# Patient Record
Sex: Female | Born: 1985 | Race: Black or African American | Hispanic: No | Marital: Single | Smoking: Never smoker
Health system: Southern US, Community
[De-identification: ages and names within clinical notes are randomized; demographics above are authoritative.]

---

## 2016-01-08 ENCOUNTER — Other Ambulatory Visit (HOSPITAL_COMMUNITY)
Admission: RE | Admit: 2016-01-08 | Discharge: 2016-01-08 | Disposition: A | Discharge status: Home / Self Care | Payer: Medicaid Other | Source: Ambulatory Visit | Attending: Obstetrics & Gynecology | Admitting: Obstetrics & Gynecology

## 2016-01-08 ENCOUNTER — Encounter: Payer: Self-pay | Admitting: Obstetrics & Gynecology

## 2016-01-08 ENCOUNTER — Encounter: Payer: Self-pay | Admitting: *Deleted

## 2016-01-08 ENCOUNTER — Ambulatory Visit (INDEPENDENT_AMBULATORY_CARE_PROVIDER_SITE_OTHER): Payer: Medicaid Other | Admitting: Obstetrics & Gynecology

## 2016-01-08 VITALS — BP 118/78 | HR 97 | Wt 131.0 lb

## 2016-01-08 DIAGNOSIS — Z1151 Encounter for screening for human papillomavirus (HPV): Secondary | ICD-10-CM | POA: Insufficient documentation

## 2016-01-08 DIAGNOSIS — Z3491 Encounter for supervision of normal pregnancy, unspecified, first trimester: Secondary | ICD-10-CM

## 2016-01-08 DIAGNOSIS — O34219 Maternal care for unspecified type scar from previous cesarean delivery: Secondary | ICD-10-CM | POA: Diagnosis not present

## 2016-01-08 DIAGNOSIS — Z36 Encounter for antenatal screening of mother: Secondary | ICD-10-CM | POA: Diagnosis not present

## 2016-01-08 DIAGNOSIS — N760 Acute vaginitis: Secondary | ICD-10-CM | POA: Insufficient documentation

## 2016-01-08 DIAGNOSIS — R87810 Cervical high risk human papillomavirus (HPV) DNA test positive: Secondary | ICD-10-CM | POA: Insufficient documentation

## 2016-01-08 DIAGNOSIS — Z01419 Encounter for gynecological examination (general) (routine) without abnormal findings: Secondary | ICD-10-CM | POA: Diagnosis present

## 2016-01-08 DIAGNOSIS — Z113 Encounter for screening for infections with a predominantly sexual mode of transmission: Secondary | ICD-10-CM | POA: Insufficient documentation

## 2016-01-08 DIAGNOSIS — Z3481 Encounter for supervision of other normal pregnancy, first trimester: Secondary | ICD-10-CM | POA: Diagnosis not present

## 2016-01-08 DIAGNOSIS — Z3490 Encounter for supervision of normal pregnancy, unspecified, unspecified trimester: Secondary | ICD-10-CM | POA: Insufficient documentation

## 2016-01-08 MED ORDER — PRENATAL 27-0.8 MG PO TABS: 1.0000 | ORAL_TABLET | Freq: Every day | ORAL | Status: AC

## 2016-01-08 NOTE — Progress Notes (Signed)
Is due for a pap smear.  Seen at [facility] in [place] on 1/28 and had ultrasound at that time.  Will request records.  LMP dating matches what she was told at [facility].  Some nausea. Would like an Rx for prenatal vitamins.

## 2016-01-08 NOTE — Patient Instructions (Signed)
First Trimester of Pregnancy The first trimester of pregnancy is from week 1 until the end of week 12 (months 1 through 3). A week after a sperm fertilizes an egg, the egg will implant on the wall of the uterus. This embryo will begin to develop into a baby. Genes from you and your partner are forming the baby. The female genes determine whether the baby is a boy or a girl. At 6-8 weeks, the eyes and face are formed, and the heartbeat can be seen on ultrasound. At the end of 12 weeks, all the baby's organs are formed.  Now that you are pregnant, you will want to do everything you can to have a healthy baby. Two of the most important things are to get good prenatal care and to follow your health care provider's instructions. Prenatal care is all the medical care you receive before the baby's birth. This care will help prevent, find, and treat any problems during the pregnancy and childbirth. BODY CHANGES Your body goes through many changes during pregnancy. The changes vary from woman to woman.   You may gain or lose a couple of pounds at first.  You may feel sick to your stomach (nauseous) and throw up (vomit). If the vomiting is uncontrollable, call your health care provider.  You may tire easily.  You may develop headaches that can be relieved by medicines approved by your health care provider.  You may urinate more often. Painful urination may mean you have a bladder infection.  You may develop heartburn as a result of your pregnancy.  You may develop constipation because certain hormones are causing the muscles that push waste through your intestines to slow down.  You may develop hemorrhoids or swollen, bulging veins (varicose veins).  Your breasts may begin to grow larger and become tender. Your nipples may stick out more, and the tissue that surrounds them (areola) may become darker.  Your gums may bleed and may be sensitive to brushing and flossing.  Dark spots or blotches (chloasma,  mask of pregnancy) may develop on your face. This will likely fade after the baby is born.  Your menstrual periods will stop.  You may have a loss of appetite.  You may develop cravings for certain kinds of food.  You may have changes in your emotions from day to day, such as being excited to be pregnant or being concerned that something may go wrong with the pregnancy and baby.  You may have more vivid and strange dreams.  You may have changes in your hair. These can include thickening of your hair, rapid growth, and changes in texture. Some women also have hair loss during or after pregnancy, or hair that feels dry or thin. Your hair will most likely return to normal after your baby is born. WHAT TO EXPECT AT YOUR PRENATAL VISITS During a routine prenatal visit:  You will be weighed to make sure you and the baby are growing normally.  Your blood pressure will be taken.  Your abdomen will be measured to track your baby's growth.  The fetal heartbeat will be listened to starting around week 10 or 12 of your pregnancy.  Test results from any previous visits will be discussed. Your health care provider may ask you:  How you are feeling.  If you are feeling the baby move.  If you have had any abnormal symptoms, such as leaking fluid, bleeding, severe headaches, or abdominal cramping.  If you are using any tobacco products,   including cigarettes, chewing tobacco, and electronic cigarettes.  If you have any questions. Other tests that may be performed during your first trimester include:  Blood tests to find your blood type and to check for the presence of any previous infections. They will also be used to check for low iron levels (anemia) and Rh antibodies. Later in the pregnancy, blood tests for diabetes will be done along with other tests if problems develop.  Urine tests to check for infections, diabetes, or protein in the urine.  An ultrasound to confirm the proper growth  and development of the baby.  An amniocentesis to check for possible genetic problems.  Fetal screens for spina bifida and Down syndrome.  You may need other tests to make sure you and the baby are doing well.  HIV (human immunodeficiency virus) testing. Routine prenatal testing includes screening for HIV, unless you choose not to have this test. HOME CARE INSTRUCTIONS  Medicines  Follow your health care provider's instructions regarding medicine use. Specific medicines may be either safe or unsafe to take during pregnancy.  Take your prenatal vitamins as directed.  If you develop constipation, try taking a stool softener if your health care provider approves. Diet  Eat regular, well-balanced meals. Choose a variety of foods, such as meat or vegetable-based protein, fish, milk and low-fat dairy products, vegetables, fruits, and whole grain breads and cereals. Your health care provider will help you determine the amount of weight gain that is right for you.  Avoid raw meat and uncooked cheese. These carry germs that can cause birth defects in the baby.  Eating four or five small meals rather than three large meals a day may help relieve nausea and vomiting. If you start to feel nauseous, eating a few soda crackers can be helpful. Drinking liquids between meals instead of during meals also seems to help nausea and vomiting.  If you develop constipation, eat more high-fiber foods, such as fresh vegetables or fruit and whole grains. Drink enough fluids to keep your urine clear or pale yellow. Activity and Exercise  Exercise only as directed by your health care provider. Exercising will help you:  Control your weight.  Stay in shape.  Be prepared for labor and delivery.  Experiencing pain or cramping in the lower abdomen or low back is a good sign that you should stop exercising. Check with your health care provider before continuing normal exercises.  Try to avoid standing for long  periods of time. Move your legs often if you must stand in one place for a long time.  Avoid heavy lifting.  Wear low-heeled shoes, and practice good posture.  You may continue to have sex unless your health care provider directs you otherwise. Relief of Pain or Discomfort  Wear a good support bra for breast tenderness.   Take warm sitz baths to soothe any pain or discomfort caused by hemorrhoids. Use hemorrhoid cream if your health care provider approves.   Rest with your legs elevated if you have leg cramps or low back pain.  If you develop varicose veins in your legs, wear support hose. Elevate your feet for 15 minutes, 3-4 times a day. Limit salt in your diet. Prenatal Care  Schedule your prenatal visits by the twelfth week of pregnancy. They are usually scheduled monthly at first, then more often in the last 2 months before delivery.  Write down your questions. Take them to your prenatal visits.  Keep all your prenatal visits as directed by your   health care provider. Safety  Wear your seat belt at all times when driving.  Make a list of emergency phone numbers, including numbers for family, friends, the hospital, and police and fire departments. General Tips  Ask your health care provider for a referral to a local prenatal education class. Begin classes no later than at the beginning of month 6 of your pregnancy.  Ask for help if you have counseling or nutritional needs during pregnancy. Your health care provider can offer advice or refer you to specialists for help with various needs.  Do not use hot tubs, steam rooms, or saunas.  Do not douche or use tampons or scented sanitary pads.  Do not cross your legs for long periods of time.  Avoid cat litter boxes and soil used by cats. These carry germs that can cause birth defects in the baby and possibly loss of the fetus by miscarriage or stillbirth.  Avoid all smoking, herbs, alcohol, and medicines not prescribed by  your health care provider. Chemicals in these affect the formation and growth of the baby.  Do not use any tobacco products, including cigarettes, chewing tobacco, and electronic cigarettes. If you need help quitting, ask your health care provider. You may receive counseling support and other resources to help you quit.  Schedule a dentist appointment. At home, brush your teeth with a soft toothbrush and be gentle when you floss. SEEK MEDICAL CARE IF:   You have dizziness.  You have mild pelvic cramps, pelvic pressure, or nagging pain in the abdominal area.  You have persistent nausea, vomiting, or diarrhea.  You have a bad smelling vaginal discharge.  You have pain with urination.  You notice increased swelling in your face, hands, legs, or ankles. SEEK IMMEDIATE MEDICAL CARE IF:   You have a fever.  You are leaking fluid from your vagina.  You have spotting or bleeding from your vagina.  You have severe abdominal cramping or pain.  You have rapid weight gain or loss.  You vomit blood or material that looks like coffee grounds.  You are exposed to German measles and have never had them.  You are exposed to fifth disease or chickenpox.  You develop a severe headache.  You have shortness of breath.  You have any kind of trauma, such as from a fall or a car accident.   This information is not intended to replace advice given to you by your health care provider. Make sure you discuss any questions you have with your health care provider.   Document Released: 10/20/2001 Document Revised: 11/16/2014 Document Reviewed: 09/05/2013 Elsevier Interactive Patient Education 2016 [employer]  

## 2016-01-08 NOTE — Progress Notes (Signed)
   Subjective: mild nausea    Megan Boyer is a G5P3003 [redacted]w[redacted]d being seen today for her first obstetrical visit.  Her obstetrical history is significant for previous cesarean section needs repeat. Patient does intend to breast feed. Pregnancy history fully reviewed.  Patient reports nausea.  Filed Vitals:   01/08/16 0944  BP: 118/78  Pulse: 97  Weight: 131 lb (59.421 kg)    HISTORY: OB History  Gravida Para Term Preterm AB SAB TAB Ectopic Multiple Living  # Outcome Date GA Lbr Len/2nd Weight Sex Delivery Anes PTL Lv  5 Current           4 Term 07/24/13    Wandalee Ferdinand   Y  3 Gravida 03/12/08    F CS-LTranv   Y  2 Term 10/29/03    Wandalee Ferdinand   Y     Complications: Fetal Intolerance  1 Term              No past medical history on file. No past surgical history on file. Family History  Problem Relation Age of Onset  . Cancer Maternal Grandmother   . Liver disease Paternal Grandmother   . Liver disease Paternal Grandfather      Exam    Uterus:     Pelvic Exam:    Perineum: No Hemorrhoids   Vulva: normal   Vagina:  normal mucosa, normal discharge   pH:     Cervix: no lesions   Adnexa: normal adnexa   Bony Pelvis: average  System: Breast:  normal appearance, no masses or tenderness   Skin: normal coloration and turgor, no rashes    Neurologic: oriented, normal mood   Extremities: normal strength, tone, and muscle mass   HEENT trachea midline   Mouth/Teeth dental hygiene good   Neck supple   Cardiovascular: regular rate and rhythm   Respiratory:  appears well, vitals normal, no respiratory distress, acyanotic, normal RR   Abdomen: soft, non-tender; bowel sounds normal; no masses,  no organomegaly, transverse low abdominal scar   Urinary: urethral meatus normal      Assessment:    Pregnancy: [id number] Patient Active Problem List   Diagnosis Date Noted  . Supervision of low-risk pregnancy 01/08/2016  . Previous cesarean delivery,  antepartum 01/08/2016        Plan:     Initial labs drawn. Prenatal vitamins. Problem list reviewed and updated. Genetic Screening discussed First Screen: declined.  Ultrasound discussed; fetal survey: at 18+ weeks.  Follow up in 4 weeks. 50% of 30 min visit spent on counseling and coordination of care.  US shows viable singleton pregnancy   ARNOLD,JAMES 01/08/2016

## 2016-01-09 LAB — PRENATAL PROFILE (SOLSTAS)
Antibody Screen: NEGATIVE
Basophils Absolute: 0.1 10*3/uL (ref 0.0–0.1)
Basophils Relative: 1 % (ref 0–1)
Eosinophils Absolute: 0.4 10*3/uL (ref 0.0–0.7)
Eosinophils Relative: 6 % — ABNORMAL HIGH (ref 0–5)
HCT: 36.9 % (ref 36.0–46.0)
HEP B S AG: NEGATIVE
HIV: NONREACTIVE
Hemoglobin: 12.4 g/dL (ref 12.0–15.0)
LYMPHS PCT: 28 % (ref 12–46)
Lymphs Abs: 1.7 10*3/uL (ref 0.7–4.0)
MCH: 30.3 pg (ref 26.0–34.0)
MCHC: 33.6 g/dL (ref 30.0–36.0)
MCV: 90.2 fL (ref 78.0–100.0)
MONO ABS: 0.6 10*3/uL (ref 0.1–1.0)
MONOS PCT: 11 % (ref 3–12)
MPV: 10.6 fL (ref 8.6–12.4)
NEUTROS ABS: 3.2 10*3/uL (ref 1.7–7.7)
Neutrophils Relative %: 54 % (ref 43–77)
PLATELETS: 202 10*3/uL (ref 150–400)
RBC: 4.09 MIL/uL (ref 3.87–5.11)
RDW: 13.2 % (ref 11.5–15.5)
RUBELLA: 4.89 {index} — AB (ref <0.90)
Rh Type: POSITIVE
WBC: 5.9 10*3/uL (ref 4.0–10.5)

## 2016-01-09 LAB — CULTURE, OB URINE
COLONY COUNT: NO GROWTH
ORGANISM ID, BACTERIA: NO GROWTH

## 2016-01-09 LAB — CYTOLOGY - PAP

## 2016-01-10 LAB — CERVICOVAGINAL ANCILLARY ONLY: CANDIDA VAGINITIS: NEGATIVE

## 2016-01-14 ENCOUNTER — Telehealth: Payer: Self-pay | Admitting: General Practice

## 2016-01-14 DIAGNOSIS — N760 Acute vaginitis: Principal | ICD-10-CM

## 2016-01-14 DIAGNOSIS — B9689 Other specified bacterial agents as the cause of diseases classified elsewhere: Secondary | ICD-10-CM

## 2016-01-14 MED ORDER — METRONIDAZOLE 500 MG PO TABS: 500.0000 mg | ORAL_TABLET | Freq: Two times a day (BID) | ORAL | Status: DC

## 2016-01-14 NOTE — Telephone Encounter (Signed)
Per Dr Debroah LoopArnold, patient has BV and needs Rx sent to pharmacy. Called patient & informed her of results & medication sent to pharmacy. Patient verbalized understanding & had no questions

## 2016-01-22 ENCOUNTER — Telehealth: Payer: Self-pay | Admitting: *Deleted

## 2016-01-22 MED ORDER — TERCONAZOLE 0.4 % VA CREA: TOPICAL_CREAM | VAGINAL | Status: AC

## 2016-01-22 NOTE — Telephone Encounter (Signed)
Called pt and informed her of Pap results and recommendation to repeat Pap w/co-testing in 1 year.  Pt voiced understanding and requested Rx for vaginal yeast because she just finished Tx for BV w/metronidazole and has vaginal itching and white discharge. Rx for Terazol sent per standing order.

## 2016-01-27 ENCOUNTER — Encounter: Payer: Self-pay | Admitting: *Deleted

## 2016-02-05 ENCOUNTER — Ambulatory Visit (INDEPENDENT_AMBULATORY_CARE_PROVIDER_SITE_OTHER): Payer: Medicaid Other | Admitting: Certified Nurse Midwife

## 2016-02-05 VITALS — BP 124/81 | HR 93 | Ht 63.0 in | Wt 131.0 lb

## 2016-02-05 DIAGNOSIS — Z3482 Encounter for supervision of other normal pregnancy, second trimester: Secondary | ICD-10-CM

## 2016-02-05 DIAGNOSIS — O34219 Maternal care for unspecified type scar from previous cesarean delivery: Secondary | ICD-10-CM

## 2016-02-05 DIAGNOSIS — Z3492 Encounter for supervision of normal pregnancy, unspecified, second trimester: Secondary | ICD-10-CM

## 2016-02-05 NOTE — Patient Instructions (Signed)

## 2016-02-05 NOTE — Progress Notes (Signed)
Subjective:  Megan Boyer is a 30 y.o. G5P3003 at 2215w5d being seen today for ongoing prenatal care.  She is currently monitored for the following issues for this low-risk pregnancy and has Supervision of low-risk pregnancy and Previous cesarean delivery, antepartum on her problem list.  Patient reports no complaints.  Contractions: Not present. Vag. Bleeding: None.  Movement: Absent. Denies leaking of fluid.   The following portions of the patient's history were reviewed and updated as appropriate: allergies, current medications, past family history, past medical history, past social history, past surgical history and problem list. Problem list updated.  Objective:   Filed Vitals:   02/05/16 1515 02/05/16 1520  BP: 124/81   Pulse: 93   Height:  5\' 3"  (1.6 m)  Weight: 131 lb (59.421 kg)     Fetal Status:     Movement: Absent     General:  Alert, oriented and cooperative. Patient is in no acute distress.  Skin: Skin is warm and dry. No rash noted.   Cardiovascular: Normal heart rate noted  Respiratory: Normal respiratory effort, no problems with respiration noted  Abdomen: Soft, gravid, appropriate for gestational age. Pain/Pressure: Absent     Pelvic: Vag. Bleeding: None Vag D/C Character: Thin   Cervical exam deferred        Extremities: Normal range of motion.     Mental Status: Normal mood and affect. Normal behavior. Normal judgment and thought content.   Urinalysis: Urine Protein: Trace Urine Glucose: Negative  Assessment and Plan:  Pregnancy: G5P3003 at 7615w5d  1. Supervision of low-risk pregnancy, second trimester  - US MFM OB COMP + 14 WK; Future  2. Previous cesarean delivery, antepartum Wants to VBAC after 3 C/S  Preterm labor symptoms and general obstetric precautions including but not limited to vaginal bleeding, contractions, leaking of fluid and fetal movement were reviewed in detail with the patient. Please refer to After Visit Summary for other  counseling recommendations.  Return in about 7 weeks (around 03/25/2016) for schedule anatomy ultrasound.   Rhea PinkLori A Clemmons, CNM

## 2016-02-24 ENCOUNTER — Encounter (HOSPITAL_COMMUNITY): Payer: Self-pay | Admitting: Certified Nurse Midwife

## 2016-03-06 ENCOUNTER — Other Ambulatory Visit: Payer: Self-pay | Admitting: Certified Nurse Midwife

## 2016-03-06 ENCOUNTER — Ambulatory Visit (HOSPITAL_COMMUNITY)
Admission: RE | Admit: 2016-03-06 | Discharge: 2016-03-06 | Disposition: A | Discharge status: Home / Self Care | Payer: Medicaid Other | Source: Ambulatory Visit | Attending: Certified Nurse Midwife | Admitting: Certified Nurse Midwife

## 2016-03-06 DIAGNOSIS — O34219 Maternal care for unspecified type scar from previous cesarean delivery: Secondary | ICD-10-CM

## 2016-03-06 DIAGNOSIS — Z3A18 18 weeks gestation of pregnancy: Secondary | ICD-10-CM | POA: Diagnosis not present

## 2016-03-06 DIAGNOSIS — Z3689 Encounter for other specified antenatal screening: Secondary | ICD-10-CM

## 2016-03-06 DIAGNOSIS — Z3492 Encounter for supervision of normal pregnancy, unspecified, second trimester: Secondary | ICD-10-CM

## 2016-03-06 DIAGNOSIS — Z36 Encounter for antenatal screening of mother: Secondary | ICD-10-CM | POA: Diagnosis not present

## 2016-03-24 ENCOUNTER — Encounter: Payer: Self-pay | Admitting: Obstetrics and Gynecology

## 2016-03-24 ENCOUNTER — Ambulatory Visit (INDEPENDENT_AMBULATORY_CARE_PROVIDER_SITE_OTHER): Payer: Medicaid Other | Admitting: Obstetrics and Gynecology

## 2016-03-24 VITALS — BP 118/73 | HR 101 | Wt 141.0 lb

## 2016-03-24 DIAGNOSIS — Z3482 Encounter for supervision of other normal pregnancy, second trimester: Secondary | ICD-10-CM

## 2016-03-24 DIAGNOSIS — O34219 Maternal care for unspecified type scar from previous cesarean delivery: Secondary | ICD-10-CM | POA: Diagnosis not present

## 2016-03-24 DIAGNOSIS — Z3492 Encounter for supervision of normal pregnancy, unspecified, second trimester: Secondary | ICD-10-CM

## 2016-03-24 NOTE — Progress Notes (Signed)
Subjective:  Megan Boyer is a 30 y.o. G5P3003 at 8734w4d being seen today for ongoing prenatal care.  She is currently monitored for the following issues for this low-risk pregnancy and has Supervision of low-risk pregnancy and Previous cesarean delivery, antepartum on her problem list.  Patient reports no complaints.  Contractions: Irregular. Vag. Bleeding: None.  Movement: Present. Denies leaking of fluid.   The following portions of the patient's history were reviewed and updated as appropriate: allergies, current medications, past family history, past medical history, past social history, past surgical history and problem list. Problem list updated.  Objective:   Filed Vitals:   03/24/16 0830  BP: 118/73  Pulse: 101  Weight: 141 lb (63.957 kg)    Fetal Status: Fetal Heart Rate (bpm): 143 Fundal Height: 20 cm Movement: Present     General:  Alert, oriented and cooperative. Patient is in no acute distress.  Skin: Skin is warm and dry. No rash noted.   Cardiovascular: Normal heart rate noted  Respiratory: Normal respiratory effort, no problems with respiration noted  Abdomen: Soft, gravid, appropriate for gestational age. Pain/Pressure: Absent     Pelvic: Vag. Bleeding: None Vag D/C Character: Thin   Cervical exam deferred        Extremities: Normal range of motion.  Edema: None  Mental Status: Normal mood and affect. Normal behavior. Normal judgment and thought content.   Urinalysis: Urine Protein: Trace Urine Glucose: Negative  Assessment and Plan:  Pregnancy: G5P3003 at 6534w4d  1. Previous cesarean delivery, antepartum Will need to be scheduled for repeat   2. Supervision of low-risk pregnancy, second trimester Continue routine prenatal care 1hr glucola and labs at next visit Reviewed anatomy ultrasound results  General obstetric precautions including but not limited to vaginal bleeding, contractions, leaking of fluid and fetal movement were reviewed in detail  with the patient. Please refer to After Visit Summary for other counseling recommendations.  Return in about 8 weeks (around 05/19/2016) for ROB and 1 hour glucola.   Catalina AntiguaPeggy Tiwanna Tuch, MD

## 2016-11-12 ENCOUNTER — Encounter (HOSPITAL_COMMUNITY): Payer: Self-pay

## 2019-01-09 ENCOUNTER — Encounter (HOSPITAL_COMMUNITY): Payer: Self-pay | Admitting: Emergency Medicine

## 2019-01-09 ENCOUNTER — Ambulatory Visit (HOSPITAL_COMMUNITY)
Admission: EM | Admit: 2019-01-09 | Discharge: 2019-01-09 | Disposition: A | Discharge status: Home / Self Care | Payer: Self-pay | Attending: Family Medicine | Admitting: Family Medicine

## 2019-01-09 DIAGNOSIS — L02412 Cutaneous abscess of left axilla: Secondary | ICD-10-CM

## 2019-01-09 MED ORDER — SULFAMETHOXAZOLE-TRIMETHOPRIM 800-160 MG PO TABS: 1.0000 | ORAL_TABLET | Freq: Two times a day (BID) | ORAL | 0 refills | Status: AC

## 2019-01-09 MED ORDER — HYDROCODONE-ACETAMINOPHEN 5-325 MG PO TABS: 1.0000 | ORAL_TABLET | Freq: Four times a day (QID) | ORAL | 0 refills | Status: AC | PRN

## 2019-01-09 NOTE — ED Provider Notes (Signed)
[facility]   [id number] 01/09/19 Arrival Time: 1005  ASSESSMENT & PLAN:  1. Abscess of left axilla    Incision and Drainage Procedure Note  Anesthesia: 1% plain lidocaine  Procedure Details  The procedure, risks and complications have been discussed in detail (including, but not limited to pain and bleeding) with the patient.  The skin induration was prepped and draped in the usual fashion. After adequate local anesthesia, I&D with a #11 blade was performed on the left axilla with copious purulent drainage.  EBL: minimal Drains: none Packing: placed Condition: Tolerated procedure well Complications: none.  Meds ordered this encounter  Medications  . HYDROcodone-acetaminophen (NORCO/VICODIN) 5-325 MG tablet    Sig: Take 1 tablet by mouth every 6 (six) hours as needed for moderate pain or severe pain.    Dispense:  8 tablet    Refill:  0  . sulfamethoxazole-trimethoprim (BACTRIM DS,SEPTRA DS) 800-160 MG tablet    Sig: Take 1 tablet by mouth 2 (two) times daily for 7 days.    Dispense:  14 tablet    Refill:  0   [place] Controlled Substances Registry consulted for this patient. I feel the risk/benefit ratio today is favorable for proceeding with this prescription for a controlled substance. Medication sedation precautions given.  Wound care instructions discussed and given in written format. To return in 48 hours for wound check.  Finish all antibiotics. OTC analgesics as needed.  Reviewed expectations re: course of current medical issues. Questions answered. Outlined signs and symptoms indicating need for more acute intervention. Patient verbalized understanding. After Visit Summary given.   SUBJECTIVE:  Megan Boyer is a 33 y.o. female who presents with a possible infection of her L axilla. Onset gradual, over the past 5-6 days; without active drainage and without active bleeding. Increasing pain. Symptoms have gradually worsened since beginning.  Fever: absent. OTC/home treatment: Aleve with mild pain relief. No h/o recurrent skin infections.  ROS: As per HPI.  OBJECTIVE:  Vitals:   01/09/19 1024  BP: 102/76  Pulse: (!) 104  Resp: 18  Temp: 98.8 F (37.1 C)  TempSrc: Oral  SpO2: 100%     General appearance: alert; no distress Skin: approx 2 cm induration of her L axilla; tender to touch; no active drainage or bleeding LUE: FROM; normal distal sensation Psychological: alert and cooperative; normal mood and affect  No Known Allergies   Social History   Socioeconomic History  . Marital status: Single    Spouse name: Not on file  . Number of children: Not on file  . Years of education: Not on file  . Highest education level: Not on file  Occupational History  . Not on file  Social Needs  . Financial resource strain: Not on file  . Food insecurity:    Worry: Not on file    Inability: Not on file  . Transportation needs:    Medical: Not on file    Non-medical: Not on file  Tobacco Use  . Smoking status: Never Smoker  . Smokeless tobacco: Never Used  Substance and Sexual Activity  . Alcohol use: Yes    Comment: social when not pregnant  . Drug use: No  . Sexual activity: Yes    Birth control/protection: None  Lifestyle  . Physical activity:    Days per week: Not on file    Minutes per session: Not on file  . Stress: Not on file  Relationships  . Social connections:    Talks on phone:  Not on file    Gets together: Not on file    Attends religious service: Not on file    Active member of club or organization: Not on file    Attends meetings of clubs or organizations: Not on file    Relationship status: Not on file  Other Topics Concern  . Not on file  Social History Narrative  . Not on file   Family History  Problem Relation Age of Onset  . Cancer Maternal Grandmother   . Liver disease Paternal Grandmother   . Liver disease Paternal Grandfather    History reviewed. No pertinent surgical  history.         Mardella Layman, MD 01/09/19 1122

## 2019-01-09 NOTE — Discharge Instructions (Addendum)
You have had an abscess drained today and you may have had packing placed in the wound to help the abscess continue to drain at home. If packing was placed, please do not remove it. You may shower with the packing in place. Let the soapy water clean your wound. Do not scrub it. Keep your wound covered. Follow up at the Urgent Care in 2 days for a wound check and/or packing removal. Return to the Urgent Care immediately if you develop any of the following symptoms: fever, Increased redness or swelling around where your abscess was, increased pain, or generalized weakness or vomiting. ° °Be aware, pain medications may cause drowsiness. Please do not drive, operate heavy machinery or make important decisions while on this medication, it can cloud your judgement. °

## 2019-01-09 NOTE — ED Triage Notes (Signed)
Pt here for abscess under left axillary area with pain and swelling

## 2019-01-11 ENCOUNTER — Ambulatory Visit (HOSPITAL_COMMUNITY)
Admission: EM | Admit: 2019-01-11 | Discharge: 2019-01-11 | Disposition: A | Discharge status: Home / Self Care | Payer: Self-pay | Attending: Family Medicine | Admitting: Family Medicine

## 2019-01-11 ENCOUNTER — Encounter (HOSPITAL_COMMUNITY): Payer: Self-pay

## 2019-01-11 DIAGNOSIS — L02412 Cutaneous abscess of left axilla: Secondary | ICD-10-CM

## 2019-01-11 NOTE — ED Triage Notes (Signed)
Pt presents to have packing removed from wound.

## 2019-01-11 NOTE — ED Provider Notes (Signed)
  [facility]   [id number] 01/11/19 Arrival Time: 1433  ASSESSMENT & PLAN:  1. Abscess of left axilla     Wound looks good. Packing removed without complication. Continue current wound care. Finish antibiotic. May f/u as needed.  Reviewed expectations re: course of current medical issues. Questions answered. Outlined signs and symptoms indicating need for more acute intervention. Patient verbalized understanding. After Visit Summary given.   SUBJECTIVE:  Megan Boyer is a 33 y.o. female who returns for a wound check and packing removal. Feeling better. Tolerating antibiotic. No concerns.  ROS: As per HPI.  OBJECTIVE:  Vitals:   01/11/19 1452  BP: 115/73  Pulse: 85  Resp: 18  Temp: 98.6 F (37 C)  TempSrc: Oral  SpO2: 100%     General appearance: alert; no distress Skin: wound looks good; packing in place; no active drainage or bleeding; minimal tenderness Psychological: alert and cooperative; normal mood and affect  No Known Allergies   Social History   Socioeconomic History  . Marital status: Single    Spouse name: Not on file  . Number of children: Not on file  . Years of education: Not on file  . Highest education level: Not on file  Occupational History  . Not on file  Social Needs  . Financial resource strain: Not on file  . Food insecurity:    Worry: Not on file    Inability: Not on file  . Transportation needs:    Medical: Not on file    Non-medical: Not on file  Tobacco Use  . Smoking status: Never Smoker  . Smokeless tobacco: Never Used  Substance and Sexual Activity  . Alcohol use: Yes    Comment: social when not pregnant  . Drug use: No  . Sexual activity: Yes    Birth control/protection: None  Lifestyle  . Physical activity:    Days per week: Not on file    Minutes per session: Not on file  . Stress: Not on file  Relationships  . Social connections:    Talks on phone: Not on file    Gets together: Not on  file    Attends religious service: Not on file    Active member of club or organization: Not on file    Attends meetings of clubs or organizations: Not on file    Relationship status: Not on file  Other Topics Concern  . Not on file  Social History Narrative  . Not on file   Family History  Problem Relation Age of Onset  . Cancer Maternal Grandmother   . Liver disease Paternal Grandmother   . Liver disease Paternal Grandfather    History reviewed. No pertinent surgical history.         Mardella Layman, MD 01/11/19 1524

## 2020-02-07 ENCOUNTER — Other Ambulatory Visit: Payer: Self-pay | Admitting: Urology

## 2020-02-07 DIAGNOSIS — N281 Cyst of kidney, acquired: Secondary | ICD-10-CM

## 2020-03-04 ENCOUNTER — Other Ambulatory Visit: Payer: Self-pay

## 2020-03-04 ENCOUNTER — Ambulatory Visit
Admission: RE | Admit: 2020-03-04 | Discharge: 2020-03-04 | Disposition: A | Discharge status: Home / Self Care | Payer: Medicaid Other | Source: Ambulatory Visit | Attending: Urology | Admitting: Urology

## 2020-03-04 DIAGNOSIS — N281 Cyst of kidney, acquired: Secondary | ICD-10-CM

## 2020-03-04 MED ORDER — GADOBENATE DIMEGLUMINE 529 MG/ML IV SOLN
15.0000 mL | Freq: Once | INTRAVENOUS | Status: AC | PRN
  Administered 2020-03-04: 15 mL via INTRAVENOUS

## 2020-03-22 ENCOUNTER — Other Ambulatory Visit: Payer: Self-pay

## 2020-03-22 DIAGNOSIS — N281 Cyst of kidney, acquired: Secondary | ICD-10-CM

## 2020-09-10 ENCOUNTER — Other Ambulatory Visit: Payer: Self-pay

## 2020-09-25 ENCOUNTER — Ambulatory Visit: Payer: Medicaid Other | Admitting: Urology

## 2020-11-06 ENCOUNTER — Ambulatory Visit: Payer: Medicaid Other | Admitting: Urology

## 2020-12-17 ENCOUNTER — Ambulatory Visit: Payer: Medicaid Other | Admitting: Urology

## 2021-01-29 ENCOUNTER — Ambulatory Visit: Payer: Medicaid Other | Admitting: Urology

## 2021-07-18 IMAGING — MR MR ABDOMEN WO/W CM
17 series · 48 of 48 positions shown · IV contrast (14ml multihance)
Comparison: None.

CLINICAL DATA: Renal cyst infection

EXAM:
MRI ABDOMEN WITHOUT AND WITH CONTRAST
TECHNIQUE: Multiplanar multisequence MR imaging of the abdomen was performed
both before and after the administration of intravenous contrast.
CONTRAST:  15mL MULTIHANCE GADOBENATE DIMEGLUMINE 529 MG/ML IV SOLN

[Series 2: T2 · coronal · 5.0mm · 1.04mm/px · 1 of 27 slices shown (1 of 3)]
[im 1/27]
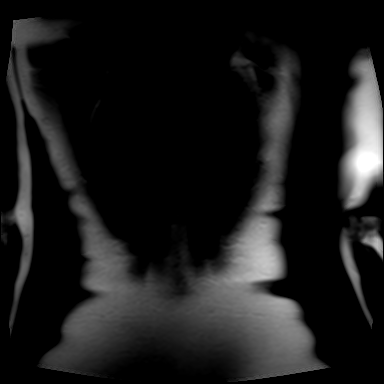

[Series 3: T2 · axial · 5.0mm · 0.89mm/px · 1 of 39 slices shown (2 of 3)]
[im 1/39]
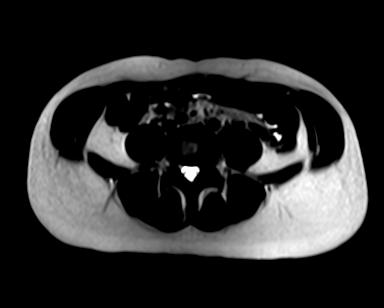

[Series 4: ep2d_[id number]_500_800_p2_trig · axial · 6.0mm · 1.98mm/px · z∈[-135,+75]mm · 2 of 84 slices shown]
[im 1/84]
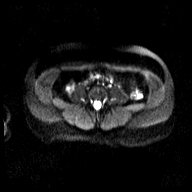
[im 84/84]
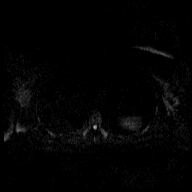

[Series 5: ep2d_[id number]_p2_trig_adc · axial · 6.0mm · 1.98mm/px · 1 of 28 slices shown]
[im 1/28]
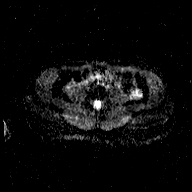

[Series 6: T2 · axial · 5.0mm · 1.33mm/px · 1 of 37 slices shown (3 of 3)]
[im 1/37]
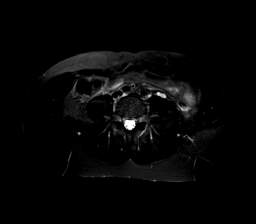

[Series 7: bSSFP · axial · 4.0mm · 0.74mm/px · 1 of 56 slices shown]
[im 1/56]
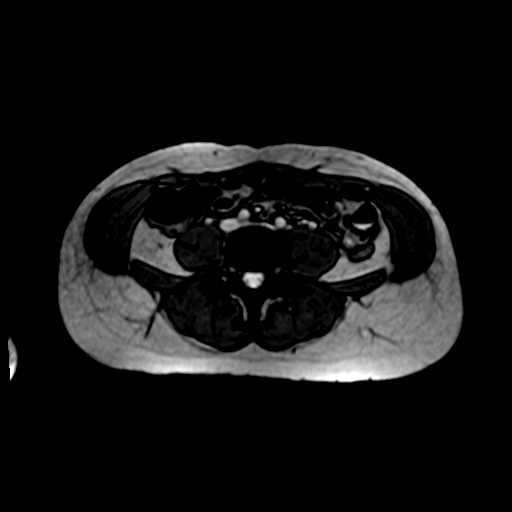

[Series 8: T1 · axial · 5.0mm · 0.89mm/px · z∈[-140,+76]mm · 2 of 74 slices shown]
[im 1/74]
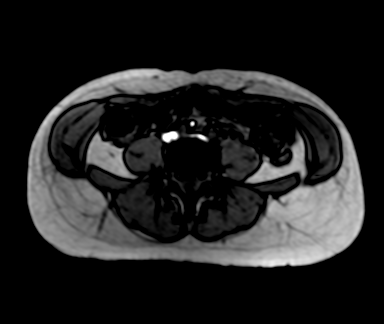
[im 74/74]
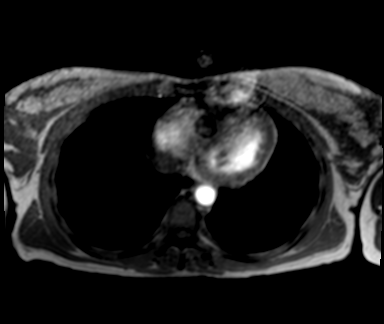

[Series 9: T1 dynamic · axial · non-contrast · 2.3mm · 2.25mm/px · z∈[-150,+86]mm · 4 of 104 slices shown]
[im 1/104]
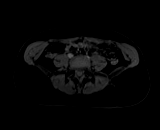
[im 35/104]
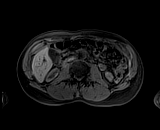
[im 69/104]
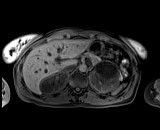
[im 104/104]
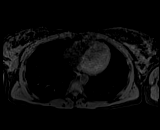

[Series 10: post 25 sec · axial · 2.3mm · 2.25mm/px · z∈[-150,+86]mm · 4 of 104 slices shown]
[im 1/104]
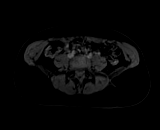
[im 35/104]
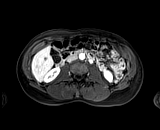
[im 69/104]
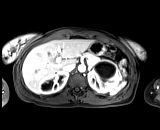
[im 104/104]
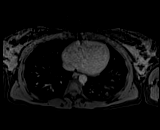

[Series 11: post 25 sec_sub · axial · 2.3mm · 2.25mm/px · z∈[-150,+86]mm · 4 of 104 slices shown]
[im 1/104]
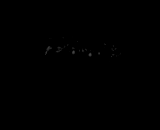
[im 35/104]
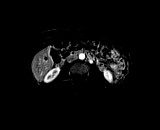
[im 69/104]
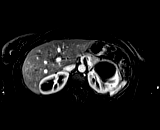
[im 104/104]
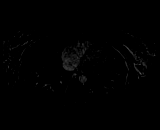

[Series 12: post 45 sec · axial · 2.3mm · 2.25mm/px · z∈[-150,+86]mm · 4 of 104 slices shown]
[im 1/104]
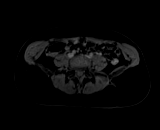
[im 35/104]
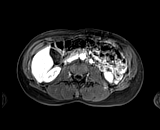
[im 69/104]
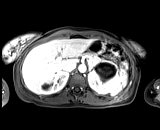
[im 104/104]
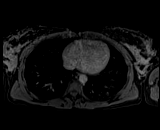

[Series 13: post 45 sec_sub · axial · 2.3mm · 2.25mm/px · z∈[-150,+86]mm · 4 of 104 slices shown]
[im 1/104]
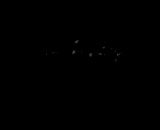
[im 35/104]
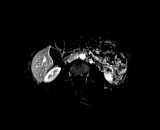
[im 69/104]
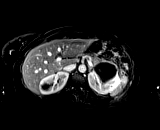
[im 104/104]
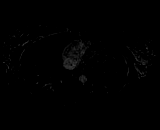

[Series 14: post 90 sec · axial · 2.3mm · 2.25mm/px · z∈[-150,+86]mm · 4 of 104 slices shown]
[im 1/104]
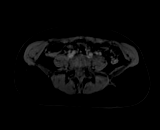
[im 35/104]
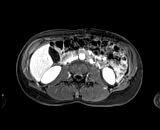
[im 69/104]
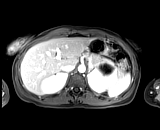
[im 104/104]
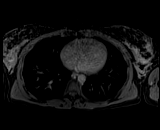

[Series 15: post 90 sec_sub · axial · 2.3mm · 2.25mm/px · z∈[-150,+86]mm · 4 of 104 slices shown]
[im 1/104]
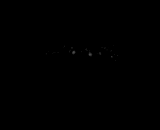
[im 35/104]
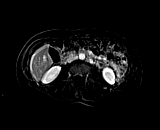
[im 69/104]
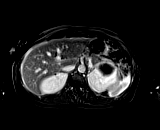
[im 104/104]
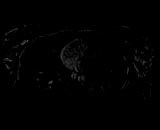

[Series 16: T1 dynamic post-contrast · coronal · 2.6mm · 0.78mm/px · 3 of 72 slices shown]
[im 1/72]
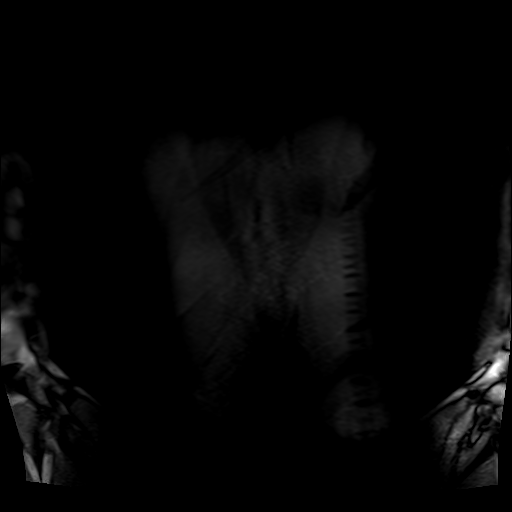
[im 36/72]
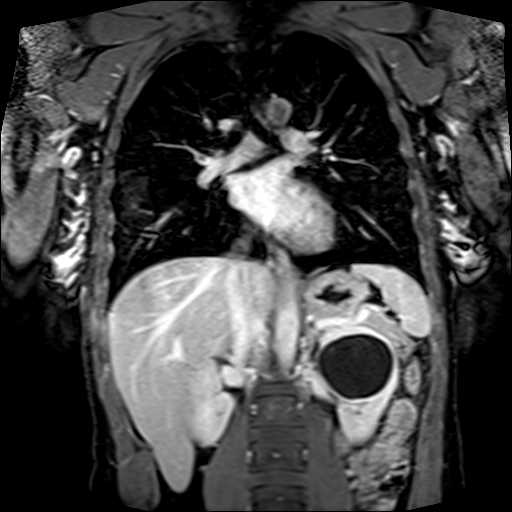
[im 72/72]
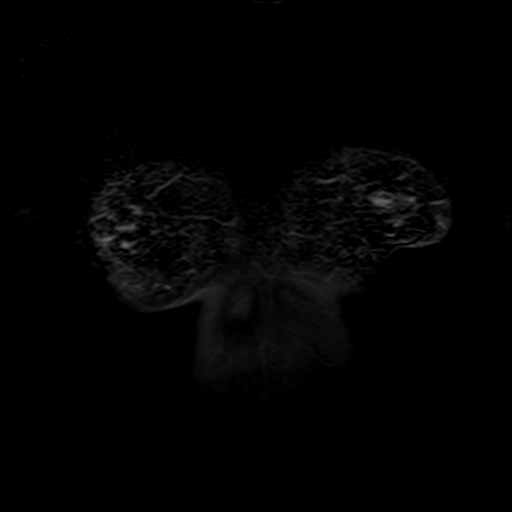

[Series 17: post axial 3+ · axial · 2.3mm · 2.25mm/px · z∈[-150,+86]mm · 4 of 104 slices shown (1 of 2)]
[im 1/104]
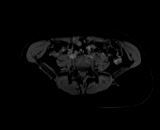
[im 35/104]
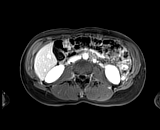
[im 69/104]
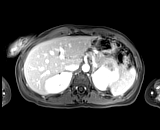
[im 104/104]
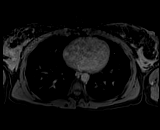

[Series 18: post axial 3+ · axial · 2.3mm · 2.25mm/px · z∈[-150,+86]mm · 4 of 104 slices shown (2 of 2)]
[im 1/104]
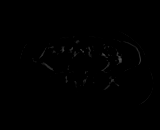
[im 35/104]
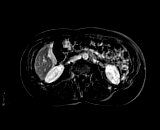
[im 69/104]
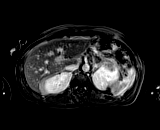
[im 104/104]
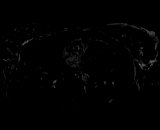

[48 of 48 positions shown; findings below may reference images not displayed]

FINDINGS: Lower chest:  Lung bases are clear.

Hepatobiliary: No focal hepatic lesion. No biliary duct dilatation.
Gallbladder and biliary tree are normal.

Pancreas: Normal pancreatic parenchymal intensity. No ductal
dilatation or inflammation.

Spleen: Normal spleen.

Adrenals/urinary tract: Adrenal glands normal.

Cystic lesion in the cortex of the RIGHT kidney measures 2.8 cm
(image 38/series 10). This lesion is predominantly low signal
intensity on T1 weighted imaging and high signal intensity on T2
weighted imaging typical of benign cysts. There is dependent
material within the cyst which has high signal intensity on
noncontrast T1 weighted imaging (image 40/9). Postcontrast imaging
demonstrates subtle a septal enhancement within the cystic lesion
(image 43/series 12).

Within the LEFT kidney there is a large cystic lesion measuring
cm (image 14/series 14 ) which has typical high signal T2 weighted
imaging and low on T1 weighted imaging without internal complexity
or enhancement.

No renal obstruction.

Stomach/Bowel: Stomach and limited of the small bowel is
unremarkable

Vascular/Lymphatic: Abdominal aortic normal caliber. No
retroperitoneal periportal lymphadenopathy.

Musculoskeletal: No aggressive osseous lesion
IMPRESSION: 1. Mildly complex RIGHT renal cyst with dependent blood product or
proteinaceous debris. Mild internal complexity with some septal
enhancement. Lesion categorizes as Bosniak 2F with consensus
guidelines follow-up recommended in 6 months. Imaging
characteristics would be consistent with given history of "renal
cyst infection". This recommendation follows ACR consensus
guidelines: Management of the Incidental Renal Mass on CT: A White
Paper of the ACR Incidental Findings Committee. [HOSPITAL]
2. Large benign Bosniak I renal cyst of the LEFT kidney.
